# Patient Record
Sex: Female | Born: 1970 | Race: White | Hispanic: No | Marital: Single | Smoking: Never smoker
Health system: Southern US, Community
[De-identification: ages and names within clinical notes are randomized; demographics above are authoritative.]

## PROBLEM LIST (undated history)

## (undated) DIAGNOSIS — M1990 Unspecified osteoarthritis, unspecified site: Secondary | ICD-10-CM

## (undated) DIAGNOSIS — J189 Pneumonia, unspecified organism: Secondary | ICD-10-CM

## (undated) HISTORY — PX: DILATION AND CURETTAGE OF UTERUS: SHX78

## (undated) HISTORY — PX: ANKLE ARTHROSCOPY: SUR85

## (undated) HISTORY — PX: TUBAL LIGATION: SHX77

## (undated) HISTORY — PX: BACK SURGERY: SHX140

---

## 2014-08-21 ENCOUNTER — Other Ambulatory Visit: Payer: Self-pay | Admitting: Orthopedic Surgery

## 2014-08-29 ENCOUNTER — Encounter (HOSPITAL_COMMUNITY): Payer: Self-pay

## 2014-08-29 ENCOUNTER — Encounter (HOSPITAL_COMMUNITY)
Admission: RE | Admit: 2014-08-29 | Discharge: 2014-08-29 | Disposition: A | Discharge status: Home / Self Care | Payer: Worker's Compensation | Source: Ambulatory Visit | Attending: Orthopedic Surgery | Admitting: Orthopedic Surgery

## 2014-08-29 ENCOUNTER — Ambulatory Visit (HOSPITAL_COMMUNITY)
Admission: RE | Admit: 2014-08-29 | Discharge: 2014-08-29 | Disposition: A | Discharge status: Home / Self Care | Payer: Worker's Compensation | Source: Ambulatory Visit | Attending: Orthopedic Surgery | Admitting: Orthopedic Surgery

## 2014-08-29 DIAGNOSIS — M5002 Cervical disc disorder with myelopathy, mid-cervical region: Secondary | ICD-10-CM | POA: Diagnosis present

## 2014-08-29 DIAGNOSIS — Z01810 Encounter for preprocedural cardiovascular examination: Secondary | ICD-10-CM | POA: Insufficient documentation

## 2014-08-29 DIAGNOSIS — Z01818 Encounter for other preprocedural examination: Secondary | ICD-10-CM

## 2014-08-29 HISTORY — DX: Unspecified osteoarthritis, unspecified site: M19.90

## 2014-08-29 HISTORY — DX: Pneumonia, unspecified organism: J18.9

## 2014-08-29 LAB — URINE MICROSCOPIC-ADD ON

## 2014-08-29 LAB — COMPREHENSIVE METABOLIC PANEL
ALBUMIN: 3.7 g/dL (ref 3.5–5.2)
ALK PHOS: 59 U/L (ref 39–117)
ALT: 15 U/L (ref 0–35)
AST: 19 U/L (ref 0–37)
Anion gap: 10 (ref 5–15)
BUN: 12 mg/dL (ref 6–23)
CALCIUM: 8.9 mg/dL (ref 8.4–10.5)
CO2: 21 mmol/L (ref 19–32)
Chloride: 108 mEq/L (ref 96–112)
Creatinine, Ser: 0.57 mg/dL (ref 0.50–1.10)
GFR calc Af Amer: >90 mL/min (ref >90)
GFR calc non Af Amer: >90 mL/min (ref >90)
GLUCOSE: 100 mg/dL — AB (ref 70–99)
Potassium: 3.8 mmol/L (ref 3.5–5.1)
SODIUM: 139 mmol/L (ref 135–145)
TOTAL PROTEIN: 6.3 g/dL (ref 6.0–8.3)
Total Bilirubin: 0.3 mg/dL (ref 0.3–1.2)

## 2014-08-29 LAB — SURGICAL PCR SCREEN
MRSA, PCR: NEGATIVE
Staphylococcus aureus: POSITIVE — AB

## 2014-08-29 LAB — CBC WITH DIFFERENTIAL/PLATELET
BASOS ABS: 0 10*3/uL (ref 0.0–0.1)
BASOS PCT: 0 % (ref 0–1)
EOS PCT: 2 % (ref 0–5)
Eosinophils Absolute: 0.2 10*3/uL (ref 0.0–0.7)
HCT: 37.7 % (ref 36.0–46.0)
Hemoglobin: 12.9 g/dL (ref 12.0–15.0)
LYMPHS ABS: 1.9 10*3/uL (ref 0.7–4.0)
Lymphocytes Relative: 27 % (ref 12–46)
MCH: 29.6 pg (ref 26.0–34.0)
MCHC: 34.2 g/dL (ref 30.0–36.0)
MCV: 86.5 fL (ref 78.0–100.0)
Monocytes Absolute: 0.4 10*3/uL (ref 0.1–1.0)
Monocytes Relative: 5 % (ref 3–12)
NEUTROS PCT: 66 % (ref 43–77)
Neutro Abs: 4.6 10*3/uL (ref 1.7–7.7)
Platelets: 310 10*3/uL (ref 150–400)
RBC: 4.36 MIL/uL (ref 3.87–5.11)
RDW: 13.1 % (ref 11.5–15.5)
WBC: 7.1 10*3/uL (ref 4.0–10.5)

## 2014-08-29 LAB — URINALYSIS, ROUTINE W REFLEX MICROSCOPIC
Bilirubin Urine: NEGATIVE
Glucose, UA: NEGATIVE mg/dL
KETONES UR: NEGATIVE mg/dL
LEUKOCYTES UA: NEGATIVE
NITRITE: NEGATIVE
Protein, ur: NEGATIVE mg/dL
SPECIFIC GRAVITY, URINE: 1.014 (ref 1.005–1.030)
Urobilinogen, UA: 0.2 mg/dL (ref 0.0–1.0)
pH: 7 (ref 5.0–8.0)

## 2014-08-29 LAB — ABO/RH: ABO/RH(D): A POS

## 2014-08-29 LAB — PROTIME-INR
INR: 1.01 (ref 0.00–1.49)
Prothrombin Time: 13.4 seconds (ref 11.6–15.2)

## 2014-08-29 LAB — APTT: aPTT: 31 seconds (ref 24–37)

## 2014-08-29 NOTE — Progress Notes (Signed)
Mupirocin ointment Rx called into [facility] in [place], [place] for positive PCR of staph. Pt notified and voiced understanding.

## 2014-08-29 NOTE — Progress Notes (Signed)
req'd ekg, notes from pcp ([facility])

## 2014-08-29 NOTE — Pre-Procedure Instructions (Addendum)
Charlsie QuestChristy Perra  08/29/2014   Your procedure is scheduled on:  12.31.15  Report to [facility] short stay admitting at 1000 AM.  Call this number if you have problems the morning of surgery: [phone]   Remember:   Do not eat food or drink liquids after midnight.   Take these medicines the morning of surgery with A SIP OF WATER: pain med if needed   STOP all herbel meds, nsaids (aleve,naproxen,advil,ibuprofen) now including vitamins ,aspirin   Do not wear jewelry, make-up or nail polish.  Do not wear lotions, powders, or perfumes. You may wear deodorant.  Do not shave 48 hours prior to surgery. Men may shave face and neck.  Do not bring valuables to the hospital.  [facility] not [facility]                  for any belongings or valuables.               Contacts, dentures or bridgework may not be worn into surgery.  Leave suitcase in the car. After surgery it may be brought to your room.  For patients admitted to the hospital, discharge time is determined by your                treatment team.               Patients discharged the day of surgery will not be allowed to drive  home.  Name and phone number of your driver:   Special Instructions:  Special Instructions: [place] - Preparing for Surgery  Before surgery, you can play an important role.  Because skin is not sterile, your skin needs to be as free of germs as possible.  You can reduce the number of germs on you skin by washing with CHG (chlorahexidine gluconate) soap before surgery.  CHG is an antiseptic cleaner which kills germs and bonds with the skin to continue killing germs even after washing.  Please DO NOT use if you have an allergy to CHG or antibacterial soaps.  If your skin becomes reddened/irritated stop using the CHG and inform your nurse when you arrive at Short Stay.  Do not shave (including legs and underarms) for at least 48 hours prior to the first CHG shower.  You may shave your face.  Please follow  these instructions carefully:   1.  Shower with CHG Soap the night before surgery and the morning of Surgery.  2.  If you choose to wash your hair, wash your hair first as usual with your normal shampoo.  3.  After you shampoo, rinse your hair and body thoroughly to remove the Shampoo.  4.  Use CHG as you would any other liquid soap.  You can apply chg directly  to the skin and wash gently with scrungie or a clean washcloth.  5.  Apply the CHG Soap to your body ONLY FROM THE NECK DOWN.  Do not use on open wounds or open sores.  Avoid contact with your eyes ears, mouth and genitals (private parts).  Wash genitals (private parts)       with your normal soap.  6.  Wash thoroughly, paying special attention to the area where your surgery will be performed.  7.  Thoroughly rinse your body with warm water from the neck down.  8.  DO NOT shower/wash with your normal soap after using and rinsing off the CHG Soap.  9.  Pat yourself dry with a clean  towel.            10.  Wear clean pajamas.            11.  Place clean sheets on your bed the night of your first shower and do not sleep with pets.  Day of Surgery  Do not apply any lotions/deodorants the morning of surgery.  Please wear clean clothes to the hospital/surgery center.   Please read over the following fact sheets that you were given: Pain Booklet, Coughing and Deep Breathing, Blood Transfusion Information, MRSA Information and Surgical Site Infection Prevention

## 2014-09-03 NOTE — Progress Notes (Signed)
Pt called today stating that the Mupirocin ointment got thrown away accidentally and requested another rx be called in for her. She states she got 3 days in and I suggested that we treat the day of surgery and while she is here but she wanted to get it done before her surgery. I called in the Mupirocin Ointment Rx into [facility] in [place], [place].

## 2014-09-05 MED ORDER — POVIDONE-IODINE 7.5 % EX SOLN
Freq: Once | CUTANEOUS | Status: DC
  Filled 2014-09-05: qty 118

## 2014-09-05 MED ORDER — CEFAZOLIN SODIUM-DEXTROSE 2-3 GM-% IV SOLR
2.0000 g | INTRAVENOUS | Status: AC
  Administered 2014-09-06: 2 g via INTRAVENOUS
  Filled 2014-09-05: qty 50

## 2014-09-05 NOTE — Progress Notes (Signed)
Patient called to arrive at 900 am.

## 2014-09-05 NOTE — H&P (Signed)
     PREOPERATIVE H&P  Chief Complaint: bilateral arm pain and weakness  HPI: Tanya Reed is a 43 y.o. 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 Pneumonia     hx  . Arthritis    Past Surgical History  Procedure Laterality Date  . Tubal ligation    . Back surgery      x2  . Dilation and curettage of uterus      x2 miscarriages  . Ankle arthroscopy Right     debridement   History   Social History  . Marital Status: Single    Spouse Name: N/A    Number of Children: N/A  . Years of Education: N/A   Social History Main Topics  . Smoking status: Never Smoker   . Smokeless tobacco: Not on file  . Alcohol Use: 1.2 oz/week    2 Glasses of wine per week  . Drug Use: No  . Sexual Activity: Not on file   Other Topics Concern  . Not on file   Social History Narrative  . No narrative on file   No family history on file. No Known Allergies Prior to Admission medications   Medication Sig Start Date End Date Taking? Authorizing Provider  acetaminophen (TYLENOL) 500 MG tablet Take 1,000 mg by mouth every 6 (six) hours as needed.   Yes Historical Provider, MD  HYDROcodone-acetaminophen (NORCO) 10-325 MG per tablet Take 1 tablet by mouth every 6 (six) hours as needed for moderate pain or severe pain.   Yes Historical Provider, MD     All other systems have been reviewed and were otherwise negative with the exception of those mentioned in the HPI and as above.  Physical Exam: There were no vitals filed for this visit.  General: Alert, no acute distress Cardiovascular: No pedal edema Respiratory: No cyanosis, no use of accessory  musculature Skin: No lesions in the area of chief complaint Neurologic: Sensation intact distally Psychiatric: Patient is competent for consent with normal mood and affect Lymphatic: No axillary or cervical lymphadenopathy  MUSCULOSKELETAL: + hoffman's on right  Assessment/Plan: Myelopathy Plan for Procedure(s): ANTERIOR CERVICAL DECOMPRESSION/DISCECTOMY FUSION 1 LEVEL   Emilee HeroUMONSKI,Zenith Lamphier Rigor, MD 09/05/2014 3:48 PM

## 2014-09-06 ENCOUNTER — Encounter (HOSPITAL_COMMUNITY)
Admission: RE | Disposition: A | Discharge status: Home / Self Care | Payer: Self-pay | Source: Ambulatory Visit | Attending: Orthopedic Surgery

## 2014-09-06 ENCOUNTER — Ambulatory Visit (HOSPITAL_COMMUNITY): Payer: Worker's Compensation

## 2014-09-06 ENCOUNTER — Ambulatory Visit (HOSPITAL_COMMUNITY): Payer: Worker's Compensation | Admitting: Anesthesiology

## 2014-09-06 ENCOUNTER — Observation Stay (HOSPITAL_COMMUNITY)
Admission: RE | Admit: 2014-09-06 | Discharge: 2014-09-07 | Disposition: A | Discharge status: Home / Self Care | Payer: Worker's Compensation | Source: Ambulatory Visit | Attending: Orthopedic Surgery | Admitting: Orthopedic Surgery

## 2014-09-06 DIAGNOSIS — M5410 Radiculopathy, site unspecified: Secondary | ICD-10-CM | POA: Diagnosis present

## 2014-09-06 DIAGNOSIS — G959 Disease of spinal cord, unspecified: Secondary | ICD-10-CM

## 2014-09-06 DIAGNOSIS — M5002 Cervical disc disorder with myelopathy, mid-cervical region: Principal | ICD-10-CM | POA: Insufficient documentation

## 2014-09-06 HISTORY — PX: ANTERIOR CERVICAL DECOMP/DISCECTOMY FUSION: SHX1161

## 2014-09-06 LAB — TYPE AND SCREEN
ABO/RH(D): A POS
ABO/RH(D): A POS
Antibody Screen: NEGATIVE
Antibody Screen: NEGATIVE

## 2014-09-06 SURGERY — ANTERIOR CERVICAL DECOMPRESSION/DISCECTOMY FUSION 1 LEVEL
Anesthesia: General

## 2014-09-06 MED ORDER — SODIUM CHLORIDE 0.9 % IJ SOLN
3.0000 mL | Freq: Two times a day (BID) | INTRAMUSCULAR | Status: DC
  Administered 2014-09-06: 3 mL via INTRAVENOUS

## 2014-09-06 MED ORDER — CEFAZOLIN SODIUM 1-5 GM-% IV SOLN
1.0000 g | Freq: Three times a day (TID) | INTRAVENOUS | Status: AC
  Administered 2014-09-06 (×2): 1 g via INTRAVENOUS
  Filled 2014-09-06 (×2): qty 50

## 2014-09-06 MED ORDER — BISACODYL 5 MG PO TBEC
5.0000 mg | DELAYED_RELEASE_TABLET | Freq: Every day | ORAL | Status: DC | PRN
  Filled 2014-09-06: qty 1

## 2014-09-06 MED ORDER — ACETAMINOPHEN 160 MG/5ML PO SOLN
325.0000 mg | ORAL | Status: DC | PRN
  Filled 2014-09-06: qty 20.3

## 2014-09-06 MED ORDER — HYDROMORPHONE HCL 1 MG/ML IJ SOLN
INTRAMUSCULAR | Status: AC
  Filled 2014-09-06: qty 1

## 2014-09-06 MED ORDER — HYDROMORPHONE HCL 1 MG/ML IJ SOLN
0.2500 mg | INTRAMUSCULAR | Status: DC | PRN
  Administered 2014-09-06 (×3): 0.5 mg via INTRAVENOUS

## 2014-09-06 MED ORDER — INFLUENZA VAC SPLIT QUAD 0.5 ML IM SUSY
0.5000 mL | PREFILLED_SYRINGE | INTRAMUSCULAR | Status: DC
  Filled 2014-09-06: qty 0.5

## 2014-09-06 MED ORDER — MENTHOL 3 MG MT LOZG: 1.0000 | LOZENGE | OROMUCOSAL | Status: DC | PRN

## 2014-09-06 MED ORDER — CEFAZOLIN SODIUM-DEXTROSE 2-3 GM-% IV SOLR
INTRAVENOUS | Status: DC | PRN
  Administered 2014-09-06: 2 g via INTRAVENOUS

## 2014-09-06 MED ORDER — ACETAMINOPHEN 650 MG RE SUPP: 650.0000 mg | RECTAL | Status: DC | PRN

## 2014-09-06 MED ORDER — BUPIVACAINE-EPINEPHRINE 0.25% -1:200000 IJ SOLN
INTRAMUSCULAR | Status: DC | PRN
  Administered 2014-09-06: 10 mL

## 2014-09-06 MED ORDER — THROMBIN 20000 UNITS EX SOLR
CUTANEOUS | Status: AC
  Filled 2014-09-06: qty 20000

## 2014-09-06 MED ORDER — ACETAMINOPHEN 325 MG PO TABS: 650.0000 mg | ORAL_TABLET | ORAL | Status: DC | PRN

## 2014-09-06 MED ORDER — DOCUSATE SODIUM 100 MG PO CAPS: 100.0000 mg | ORAL_CAPSULE | Freq: Two times a day (BID) | ORAL | Status: DC

## 2014-09-06 MED ORDER — FENTANYL CITRATE 0.05 MG/ML IJ SOLN
INTRAMUSCULAR | Status: AC
  Filled 2014-09-06: qty 5

## 2014-09-06 MED ORDER — DEXAMETHASONE SODIUM PHOSPHATE 4 MG/ML IJ SOLN
INTRAMUSCULAR | Status: DC | PRN
Start: 2014-09-06 — End: 2014-09-06
  Administered 2014-09-06: 4 mg via INTRAVENOUS

## 2014-09-06 MED ORDER — ROCURONIUM BROMIDE 100 MG/10ML IV SOLN
INTRAVENOUS | Status: DC | PRN
  Administered 2014-09-06: 40 mg via INTRAVENOUS

## 2014-09-06 MED ORDER — MIDAZOLAM HCL 2 MG/2ML IJ SOLN
INTRAMUSCULAR | Status: AC
  Filled 2014-09-06: qty 2

## 2014-09-06 MED ORDER — MIDAZOLAM HCL 5 MG/5ML IJ SOLN
INTRAMUSCULAR | Status: DC | PRN
  Administered 2014-09-06: 2 mg via INTRAVENOUS

## 2014-09-06 MED ORDER — FENTANYL CITRATE 0.05 MG/ML IJ SOLN
INTRAMUSCULAR | Status: DC | PRN
  Administered 2014-09-06: 50 ug via INTRAVENOUS
  Administered 2014-09-06 (×3): 25 ug via INTRAVENOUS
  Administered 2014-09-06: 50 ug via INTRAVENOUS
  Administered 2014-09-06: 150 ug via INTRAVENOUS
  Administered 2014-09-06: 50 ug via INTRAVENOUS
  Administered 2014-09-06: 25 ug via INTRAVENOUS
  Administered 2014-09-06: 50 ug via INTRAVENOUS

## 2014-09-06 MED ORDER — FLEET ENEMA 7-19 GM/118ML RE ENEM
1.0000 | ENEMA | Freq: Once | RECTAL | Status: AC | PRN
  Filled 2014-09-06: qty 1

## 2014-09-06 MED ORDER — PHENYLEPHRINE HCL 10 MG/ML IJ SOLN
INTRAMUSCULAR | Status: DC | PRN
  Administered 2014-09-06: 40 ug via INTRAVENOUS
  Administered 2014-09-06: 80 ug via INTRAVENOUS

## 2014-09-06 MED ORDER — DIAZEPAM 5 MG PO TABS
ORAL_TABLET | ORAL | Status: AC
  Filled 2014-09-06: qty 1

## 2014-09-06 MED ORDER — NEOSTIGMINE METHYLSULFATE 10 MG/10ML IV SOLN
INTRAVENOUS | Status: DC | PRN
Start: 2014-09-06 — End: 2014-09-06
  Administered 2014-09-06: 4 mg via INTRAVENOUS

## 2014-09-06 MED ORDER — PHENOL 1.4 % MT LIQD: 1.0000 | OROMUCOSAL | Status: DC | PRN

## 2014-09-06 MED ORDER — THROMBIN 20000 UNITS EX SOLR
OROMUCOSAL | Status: DC | PRN
  Administered 2014-09-06: 20 mL via TOPICAL

## 2014-09-06 MED ORDER — ONDANSETRON HCL 4 MG/2ML IJ SOLN
4.0000 mg | INTRAMUSCULAR | Status: DC | PRN
  Administered 2014-09-06: 4 mg via INTRAVENOUS
  Filled 2014-09-06: qty 2

## 2014-09-06 MED ORDER — LIDOCAINE HCL (CARDIAC) 20 MG/ML IV SOLN
INTRAVENOUS | Status: DC | PRN
Start: 2014-09-06 — End: 2014-09-06
  Administered 2014-09-06: 70 mg via INTRAVENOUS

## 2014-09-06 MED ORDER — ACETAMINOPHEN 325 MG PO TABS: 325.0000 mg | ORAL_TABLET | ORAL | Status: DC | PRN

## 2014-09-06 MED ORDER — OXYCODONE HCL 5 MG/5ML PO SOLN: 5.0000 mg | Freq: Once | ORAL | Status: AC | PRN

## 2014-09-06 MED ORDER — OXYCODONE HCL 5 MG PO TABS
ORAL_TABLET | ORAL | Status: AC
  Filled 2014-09-06: qty 1

## 2014-09-06 MED ORDER — GLYCOPYRROLATE 0.2 MG/ML IJ SOLN
INTRAMUSCULAR | Status: DC | PRN
  Administered 2014-09-06: .6 mg via INTRAVENOUS

## 2014-09-06 MED ORDER — LIDOCAINE HCL 4 % MT SOLN
OROMUCOSAL | Status: DC | PRN
  Administered 2014-09-06: 2 mL via TOPICAL

## 2014-09-06 MED ORDER — DEXAMETHASONE SODIUM PHOSPHATE 10 MG/ML IJ SOLN
INTRAMUSCULAR | Status: DC | PRN
  Administered 2014-09-06: 10 mg via INTRAVENOUS

## 2014-09-06 MED ORDER — OXYCODONE-ACETAMINOPHEN 5-325 MG PO TABS: 1.0000 | ORAL_TABLET | ORAL | Status: DC | PRN

## 2014-09-06 MED ORDER — SENNOSIDES-DOCUSATE SODIUM 8.6-50 MG PO TABS
1.0000 | ORAL_TABLET | Freq: Every evening | ORAL | Status: DC | PRN
  Filled 2014-09-06: qty 1

## 2014-09-06 MED ORDER — ZOLPIDEM TARTRATE 5 MG PO TABS: 5.0000 mg | ORAL_TABLET | Freq: Every evening | ORAL | Status: DC | PRN

## 2014-09-06 MED ORDER — LACTATED RINGERS IV SOLN
INTRAVENOUS | Status: DC
  Administered 2014-09-06 (×2): via INTRAVENOUS

## 2014-09-06 MED ORDER — PROPOFOL 10 MG/ML IV BOLUS
INTRAVENOUS | Status: DC | PRN
  Administered 2014-09-06: 140 mg via INTRAVENOUS

## 2014-09-06 MED ORDER — BUPIVACAINE-EPINEPHRINE (PF) 0.25% -1:200000 IJ SOLN
INTRAMUSCULAR | Status: AC
  Filled 2014-09-06: qty 30

## 2014-09-06 MED ORDER — ALUM & MAG HYDROXIDE-SIMETH 200-200-20 MG/5ML PO SUSP: 30.0000 mL | Freq: Four times a day (QID) | ORAL | Status: DC | PRN

## 2014-09-06 MED ORDER — DIAZEPAM 5 MG PO TABS
5.0000 mg | ORAL_TABLET | Freq: Four times a day (QID) | ORAL | Status: DC | PRN
  Administered 2014-09-06: 5 mg via ORAL

## 2014-09-06 MED ORDER — MORPHINE SULFATE 2 MG/ML IJ SOLN
1.0000 mg | INTRAMUSCULAR | Status: DC | PRN
  Administered 2014-09-06: 2 mg via INTRAVENOUS
  Filled 2014-09-06: qty 1

## 2014-09-06 MED ORDER — SODIUM CHLORIDE 0.9 % IJ SOLN: 3.0000 mL | INTRAMUSCULAR | Status: DC | PRN

## 2014-09-06 MED ORDER — OXYCODONE HCL 5 MG PO TABS
5.0000 mg | ORAL_TABLET | Freq: Once | ORAL | Status: AC | PRN
  Administered 2014-09-06: 5 mg via ORAL

## 2014-09-06 MED ORDER — ONDANSETRON HCL 4 MG/2ML IJ SOLN
INTRAMUSCULAR | Status: DC | PRN
  Administered 2014-09-06: 4 mg via INTRAVENOUS

## 2014-09-06 SURGICAL SUPPLY — 75 items
BENZOIN TINCTURE PRP APPL 2/3 (GAUZE/BANDAGES/DRESSINGS) ×2 IMPLANT
BIT DRILL NEURO 2X3.1 SFT TUCH (MISCELLANEOUS) ×1 IMPLANT
BIT DRILL SRG 14X2.2XFLT CHK (BIT) ×1 IMPLANT
BIT DRL SRG 14X2.2XFLT CHK (BIT) ×1
BLADE SURG 15 STRL LF DISP TIS (BLADE) ×1 IMPLANT
BLADE SURG 15 STRL SS (BLADE) ×1
BLADE SURG ROTATE 9660 (MISCELLANEOUS) ×2 IMPLANT
BUR MATCHSTICK NEURO 3.0 LAGG (BURR) IMPLANT
CARTRIDGE OIL MAESTRO DRILL (MISCELLANEOUS) ×1 IMPLANT
CLSR STERI-STRIP ANTIMIC 1/2X4 (GAUZE/BANDAGES/DRESSINGS) ×2 IMPLANT
COLLAR CERV LO CONTOUR FIRM DE (SOFTGOODS) ×2 IMPLANT
CORDS BIPOLAR (ELECTRODE) ×2 IMPLANT
COVER BACK TABLE 80X110 HD (DRAPES) ×2 IMPLANT
COVER SURGICAL LIGHT HANDLE (MISCELLANEOUS) ×2 IMPLANT
CRADLE DONUT ADULT HEAD (MISCELLANEOUS) ×2 IMPLANT
DIFFUSER DRILL AIR PNEUMATIC (MISCELLANEOUS) ×2 IMPLANT
DRAIN JACKSON RD 7FR 3/32 (WOUND CARE) IMPLANT
DRAPE C-ARM 42X72 X-RAY (DRAPES) ×2 IMPLANT
DRAPE POUCH INSTRU U-SHP 10X18 (DRAPES) ×2 IMPLANT
DRAPE SURG 17X23 STRL (DRAPES) ×6 IMPLANT
DRILL BIT SKYLINE 14MM (BIT) ×1
DRILL NEURO 2X3.1 SOFT TOUCH (MISCELLANEOUS) ×2
DURAPREP 26ML APPLICATOR (WOUND CARE) ×2 IMPLANT
ELECT COATED BLADE 2.86 ST (ELECTRODE) ×2 IMPLANT
ELECT REM PT RETURN 9FT ADLT (ELECTROSURGICAL) ×2
ELECTRODE REM PT RTRN 9FT ADLT (ELECTROSURGICAL) ×1 IMPLANT
EVACUATOR SILICONE 100CC (DRAIN) IMPLANT
GAUZE SPONGE 4X4 12PLY STRL (GAUZE/BANDAGES/DRESSINGS) ×2 IMPLANT
GAUZE SPONGE 4X4 16PLY XRAY LF (GAUZE/BANDAGES/DRESSINGS) ×2 IMPLANT
GLOVE BIO SURGEON STRL SZ7 (GLOVE) ×2 IMPLANT
GLOVE BIO SURGEON STRL SZ8 (GLOVE) ×2 IMPLANT
GLOVE BIOGEL PI IND STRL 7.0 (GLOVE) ×2 IMPLANT
GLOVE BIOGEL PI IND STRL 8 (GLOVE) ×1 IMPLANT
GLOVE BIOGEL PI INDICATOR 7.0 (GLOVE) ×2
GLOVE BIOGEL PI INDICATOR 8 (GLOVE) ×1
GOWN STRL REUS W/ TWL LRG LVL3 (GOWN DISPOSABLE) ×1 IMPLANT
GOWN STRL REUS W/ TWL XL LVL3 (GOWN DISPOSABLE) ×1 IMPLANT
GOWN STRL REUS W/TWL LRG LVL3 (GOWN DISPOSABLE) ×1
GOWN STRL REUS W/TWL XL LVL3 (GOWN DISPOSABLE) ×1
IMPL S ENDOSKEL TC 7 ODEG (Orthopedic Implant) ×1 IMPLANT
IMPLANT S ENDOSKEL TC 7 ODEG (Orthopedic Implant) ×2 IMPLANT
IV CATH 14GX2 1/4 (CATHETERS) ×2 IMPLANT
KIT BASIN OR (CUSTOM PROCEDURE TRAY) ×2 IMPLANT
KIT ROOM TURNOVER OR (KITS) ×2 IMPLANT
MANIFOLD NEPTUNE II (INSTRUMENTS) ×2 IMPLANT
NEEDLE 27GAX1X1/2 (NEEDLE) ×2 IMPLANT
NEEDLE SPNL 20GX3.5 QUINCKE YW (NEEDLE) ×2 IMPLANT
NS IRRIG 1000ML POUR BTL (IV SOLUTION) ×2 IMPLANT
OIL CARTRIDGE MAESTRO DRILL (MISCELLANEOUS) ×2
PACK ORTHO CERVICAL (CUSTOM PROCEDURE TRAY) ×2 IMPLANT
PAD ARMBOARD 7.5X6 YLW CONV (MISCELLANEOUS) ×4 IMPLANT
PATTIES SURGICAL .5 X.5 (GAUZE/BANDAGES/DRESSINGS) IMPLANT
PATTIES SURGICAL .5 X1 (DISPOSABLE) IMPLANT
PIN DISTRACTION 14 (PIN) ×4 IMPLANT
PLATE SKYLINE 12MM (Plate) ×2 IMPLANT
PUTTY BONE DBX 2.5 MIS (Bone Implant) ×2 IMPLANT
SCREW VAR SELF TAP SKYLINE 14M (Screw) ×8 IMPLANT
SPONGE GAUZE 4X4 12PLY STER LF (GAUZE/BANDAGES/DRESSINGS) ×2 IMPLANT
SPONGE INTESTINAL PEANUT (DISPOSABLE) ×2 IMPLANT
SPONGE SURGIFOAM ABS GEL 100 (HEMOSTASIS) IMPLANT
STRIP CLOSURE SKIN 1/2X4 (GAUZE/BANDAGES/DRESSINGS) ×2 IMPLANT
SURGIFLO TRUKIT (HEMOSTASIS) IMPLANT
SUT MNCRL AB 4-0 PS2 18 (SUTURE) ×2 IMPLANT
SUT SILK 4 0 (SUTURE)
SUT SILK 4-0 18XBRD TIE 12 (SUTURE) IMPLANT
SUT VIC AB 2-0 CT2 18 VCP726D (SUTURE) ×2 IMPLANT
SYR BULB IRRIGATION 50ML (SYRINGE) ×2 IMPLANT
SYR CONTROL 10ML LL (SYRINGE) ×4 IMPLANT
TAPE CLOTH 4X10 WHT NS (GAUZE/BANDAGES/DRESSINGS) ×2 IMPLANT
TAPE CLOTH SURG 4X10 WHT LF (GAUZE/BANDAGES/DRESSINGS) ×2 IMPLANT
TAPE UMBILICAL COTTON 1/8X30 (MISCELLANEOUS) ×2 IMPLANT
TOWEL OR 17X24 6PK STRL BLUE (TOWEL DISPOSABLE) ×2 IMPLANT
TOWEL OR 17X26 10 PK STRL BLUE (TOWEL DISPOSABLE) ×2 IMPLANT
WATER STERILE IRR 1000ML POUR (IV SOLUTION) ×2 IMPLANT
YANKAUER SUCT BULB TIP NO VENT (SUCTIONS) ×2 IMPLANT

## 2014-09-06 NOTE — Plan of Care (Signed)
Problem: Consults Goal: Diagnosis - Spinal Surgery Outcome: Completed/Met Date Met:  09/06/14 Cervical Spine Fusion     

## 2014-09-06 NOTE — Anesthesia Preprocedure Evaluation (Addendum)
Anesthesia Evaluation  Patient identified by MRN, date of birth, ID band Patient awake    Reviewed: Allergy & Precautions, H&P , NPO status , Patient's Chart, lab work & pertinent test results  History of Anesthesia Complications Negative for: history of anesthetic complications  Airway Mallampati: II  TM Distance: >3 FB     Dental  (+) Teeth Intact, Dental Advisory Given   Pulmonary neg pulmonary ROS,  breath sounds clear to auscultation        Cardiovascular negative cardio ROS  Rhythm:Regular     Neuro/Psych C5/6 cord compression  Neuromuscular disease negative psych ROS   GI/Hepatic negative GI ROS, Neg liver ROS,   Endo/Other  Morbid obesity  Renal/GU negative Renal ROS     Musculoskeletal   Abdominal   Peds  Hematology negative hematology ROS (+)   Anesthesia Other Findings   Reproductive/Obstetrics                            Anesthesia Physical Anesthesia Plan  ASA: II  Anesthesia Plan: General   Post-op Pain Management:    Induction: Intravenous  Airway Management Planned: Oral ETT  Additional Equipment: None  Intra-op Plan:   Post-operative Plan: Extubation in OR  Informed Consent: I have reviewed the patients History and Physical, chart, labs and discussed the procedure including the risks, benefits and alternatives for the proposed anesthesia with the patient or authorized representative who has indicated his/her understanding and acceptance.   Dental advisory given  Plan Discussed with: CRNA and Surgeon  Anesthesia Plan Comments:         Anesthesia Quick Evaluation

## 2014-09-06 NOTE — Progress Notes (Signed)
Orthopedic Tech Progress Note Patient Details:  Tanya Reed April 24, 1971 [id number] Patient already has soft collar on. Patient ID: Tanya Reed, female   DOB: April 24, 1971, [age] y.o.   MRN: [id number]   Jennye MoccasinHughes, Cordel Drewes Craig 09/06/2014, 3:33 PM

## 2014-09-06 NOTE — Anesthesia Procedure Notes (Signed)
Procedure Name: Intubation Date/Time: 09/06/2014 10:57 AM Performed by: Minus LibertyAVENEL, Mertis Mosher Pre-anesthesia Checklist: Patient identified, Emergency Drugs available, Suction available, Patient being monitored and Timeout performed Patient Re-evaluated:Patient Re-evaluated prior to inductionOxygen Delivery Method: Circle system utilized Preoxygenation: Pre-oxygenation with 100% oxygen Intubation Type: IV induction Ventilation: Mask ventilation without difficulty Laryngoscope Size: Mac and 3 Grade View: Grade II Tube type: Oral Tube size: 7.0 mm Number of attempts: 1 Airway Equipment and Method: Stylet Placement Confirmation: ETT inserted through vocal cords under direct vision,  breath sounds checked- equal and bilateral,  positive ETCO2 and CO2 detector Secured at: 22 cm Tube secured with: Tape Dental Injury: Teeth and Oropharynx as per pre-operative assessment  Comments: Patient head/neck positioned by patient. Midline stabilization maintained DL.

## 2014-09-06 NOTE — Transfer of Care (Signed)
Immediate Anesthesia Transfer of Care Note  Patient: Tanya QuestChristy Reed  Procedure(s) Performed: Procedure(s) with comments: ANTERIOR CERVICAL DECOMPRESSION/DISCECTOMY FUSION 1 LEVEL (N/A) - Anterior cervical decompression fusion cervical 5-6 with instrumentation and allograft.  Patient Location: PACU  Anesthesia Type:General  Level of Consciousness: awake, alert  and oriented  Airway & Oxygen Therapy: Patient Spontanous Breathing and Patient connected to nasal cannula oxygen  Post-op Assessment: Report given to PACU RN and Post -op Vital signs reviewed and stable  Post vital signs: Reviewed and stable  Complications: No apparent anesthesia complications

## 2014-09-06 NOTE — Anesthesia Postprocedure Evaluation (Signed)
  Anesthesia Post-op Note  Patient: Tanya Reed  Procedure(s) Performed: Procedure(s) with comments: ANTERIOR CERVICAL DECOMPRESSION/DISCECTOMY FUSION 1 LEVEL (N/A) - Anterior cervical decompression fusion cervical 5-6 with instrumentation and allograft.  Patient Location: PACU  Anesthesia Type:General  Level of Consciousness: awake and alert   Airway and Oxygen Therapy: Patient Spontanous Breathing  Post-op Pain: mild  Post-op Assessment: Post-op Vital signs reviewed, Patient's Cardiovascular Status Stable, Respiratory Function Stable, Patent Airway, No signs of Nausea or vomiting and Pain level controlled  Post-op Vital Signs: Reviewed and stable  Last Vitals:  Filed Vitals:   09/06/14 1315  BP:   Pulse: 73  Temp:   Resp: 15    Complications: No apparent anesthesia complications

## 2014-09-07 DIAGNOSIS — M5002 Cervical disc disorder with myelopathy, mid-cervical region: Secondary | ICD-10-CM | POA: Diagnosis not present

## 2014-09-07 MED ORDER — ACETAMINOPHEN-CODEINE #3 300-30 MG PO TABS: 1.0000 | ORAL_TABLET | Freq: Four times a day (QID) | ORAL | Status: AC | PRN

## 2014-09-07 NOTE — Progress Notes (Signed)
PATIENT ID: Camiya Vinal  MRN: [id number]  DOB/AGE:  [birth year] / 44 y.o.  1 Day Post-Op Procedure(s) (LRB): ANTERIOR CERVICAL DECOMPRESSION/DISCECTOMY FUSION 1 LEVEL (N/A)    PROGRESS NOTE Subjective:   Patient is alert, oriented, yes Nausea, yes Vomiting, yes passing gas, yes Bowel Movement. Taking PO sips now that her nausea has subsided. Denies SOB, Chest or Calf Pain.  Patient reports pain as 4 on 0-10 scale,    Objective: Vital signs in last 24 hours: Temp:  [97 F (36.1 C)-98.1 F (36.7 C)] 97.8 F (36.6 C) (01/01 0815) Pulse Rate:  [67-101] 79 (01/01 0815) Resp:  [10-20] 18 (01/01 0815) BP: (93-121)/(59-80) 93/59 mmHg (01/01 0815) SpO2:  [91 %-98 %] 97 % (01/01 0815)    Intake/Output from previous day: I/O last 3 completed shifts: In: 1840 [P.O.:540; I.V.:1300] Out: 980 [Urine:180; Emesis/NG output:750; Blood:50]   Intake/Output this shift:     LABORATORY DATA: No results for input(s): WBC, HGB, HCT, PLT, NA, K, CL, CO2, BUN, CREATININE, GLUCOSE, GLUCAP, INR, CALCIUM in the last 72 hours.  Invalid input(s): PT, 2  Examination: Neurologically intact Neurovascular intact Sensation intact distally Intact pulses distally Incision: dressing C/D/I}  Assessment:   1 Day Post-Op Procedure(s) (LRB): ANTERIOR CERVICAL DECOMPRESSION/DISCECTOMY FUSION 1 LEVEL (N/A) ADDITIONAL DIAGNOSIS:    Plan: Soft collar at all times  Rx for valium and tylenol #3 in chart  DISCHARGE PLAN: Home  Follow up in office with Dr. Yevette Edwards 2 weeks PO.     Aleiya Rye R 09/07/2014, 10:24 AM

## 2014-09-07 NOTE — Discharge Summary (Signed)
Patient ID: Tanya Reed MRN: [id number] DOB/AGE: 04/21/1971 44 y.o.  Admit date: 09/06/2014 Discharge date: 09/07/2014  Admission Diagnoses:  Active Problems:   Radiculopathy   Discharge Diagnoses:  Same  Past Medical History  Diagnosis Date  . Pneumonia     hx  . Arthritis     Surgeries: Procedure(s): ANTERIOR CERVICAL DECOMPRESSION/DISCECTOMY FUSION 1 LEVEL on 09/06/2014   Consultants:    Discharged Condition: Improved  Hospital Course: Tanya Reed is an 44 y.o. female who was admitted 09/06/2014 for operative treatment of<principal problem not specified>. Patient has severe unremitting pain that affects sleep, daily activities, and work/hobbies. After pre-op clearance the patient was taken to the operating room on 09/06/2014 and underwent  Procedure(s): ANTERIOR CERVICAL DECOMPRESSION/DISCECTOMY FUSION 1 LEVEL.    Patient was given perioperative antibiotics: Anti-infectives    Start     Dose/Rate Route Frequency Ordered Stop   09/06/14 1515  ceFAZolin (ANCEF) IVPB 1 g/50 mL premix     1 g100 mL/hr over 30 Minutes Intravenous Every 8 hours 09/06/14 1436 09/07/14 0009   09/06/14 0600  ceFAZolin (ANCEF) IVPB 2 g/50 mL premix     2 g100 mL/hr over 30 Minutes Intravenous On call to O.R. 09/05/14 1341 09/06/14 1045       Patient was given sequential compression devices, early ambulation, and chemoprophylaxis to prevent DVT.  Patient benefited maximally from hospital stay and there were no complications.    Recent vital signs: Patient Vitals for the past 24 hrs:  BP Temp Temp src Pulse Resp SpO2  09/07/14 0815 (!) 93/59 mmHg 97.8 F (36.6 C) Oral 79 18 97 %  09/07/14 0350 110/72 mmHg 98.1 F (36.7 C) Oral 69 20 96 %  09/06/14 2350 121/79 mmHg 98 F (36.7 C) Oral 76 18 96 %  09/06/14 2000 118/80 mmHg 97.9 F (36.6 C) Oral 83 16 97 %  09/06/14 1702 111/74 mmHg 97.5 F (36.4 C) - (!) 101 18 97 %  09/06/14 1435 116/67 mmHg 97.8 F (36.6 C) - 71 18 95 %   09/06/14 1411 107/74 mmHg 97 F (36.1 C) - 87 14 98 %  09/06/14 1400 104/64 mmHg - - 76 16 98 %  09/06/14 1345 - - - 67 10 96 %  09/06/14 1341 104/68 mmHg - - - - -  09/06/14 1330 109/65 mmHg - - 69 15 97 %  09/06/14 1315 - - - 73 15 98 %  09/06/14 1311 106/62 mmHg - - - - -  09/06/14 1300 - - - 69 11 95 %  09/06/14 1256 114/69 mmHg - - - - -  09/06/14 1245 - - - 82 12 97 %  09/06/14 1241 120/73 mmHg - - - - -  09/06/14 1230 - - - 84 11 91 %  09/06/14 1226 116/74 mmHg 97.6 F (36.4 C) - - - -     Recent laboratory studies: No results for input(s): WBC, HGB, HCT, PLT, NA, K, CL, CO2, BUN, CREATININE, GLUCOSE, INR, CALCIUM in the last 72 hours.  Invalid input(s): PT, 2   Discharge Medications:     Medication List    TAKE these medications        acetaminophen 500 MG tablet  Commonly known as:  TYLENOL  Take 1,000 mg by mouth every 6 (six) hours as needed.     acetaminophen-codeine 300-30 MG per tablet  Commonly known as:  TYLENOL #3  Take 1-2 tablets by mouth every 6 (six) hours as needed for moderate pain.  Diagnostic Studies: Dg Chest 2 View  08/29/2014   CLINICAL DATA:  Preop for surgery.  EXAM: CHEST  2 VIEW  COMPARISON:  None.  FINDINGS: The heart size and mediastinal contours are within normal limits. Both lungs are clear. The visualized skeletal structures are unremarkable.  IMPRESSION: No active cardiopulmonary disease.   Electronically Signed   By: Elige Ko   On: 08/29/2014 16:02   Dg Cervical Spine 1 View  09/06/2014   CLINICAL DATA:  ACDF at C5-6 ; 8.7 seconds fluoro time reported  EXAM: DG CERVICAL SPINE - 1 VIEW; DG C-ARM 61-120 MIN  COMPARISON:  2 fluoro spot films of the cervical spine of today's date are reviewed.  FINDINGS: The patient has undergone anterior cervical fusion at C5-6 with intradiscal device placement. Radiographic positioning of the prosthetic components is good. There is surgical sponge material over the soft tissues of the lower  neck. The trachea and esophagus are intubated. The temperature probe coils upon itself and the tip lies over the posterior aspect of the oropharynx.  IMPRESSION: The patient has undergone ACDF with inter discal device placement at C5-6 without immediate postprocedure complication.   Electronically Signed   By: David  [place]   On: 09/06/2014 12:21   Dg C-arm 1-60 Min  09/06/2014   CLINICAL DATA:  ACDF at C5-6 ; 8.7 seconds fluoro time reported  EXAM: DG CERVICAL SPINE - 1 VIEW; DG C-ARM 61-120 MIN  COMPARISON:  2 fluoro spot films of the cervical spine of today's date are reviewed.  FINDINGS: The patient has undergone anterior cervical fusion at C5-6 with intradiscal device placement. Radiographic positioning of the prosthetic components is good. There is surgical sponge material over the soft tissues of the lower neck. The trachea and esophagus are intubated. The temperature probe coils upon itself and the tip lies over the posterior aspect of the oropharynx.  IMPRESSION: The patient has undergone ACDF with inter discal device placement at C5-6 without immediate postprocedure complication.   Electronically Signed   By: David  [place]   On: 09/06/2014 12:21    Disposition: Final discharge disposition not confirmed      Discharge Instructions    Call MD / Call 911    Complete by:  As directed   If you experience chest pain or shortness of breath, CALL 911 and be transported to the hospital emergency room.  If you develope a fever above 101 F, pus (white drainage) or increased drainage or redness at the wound, or calf pain, call your surgeon's office.     Constipation Prevention    Complete by:  As directed   Drink plenty of fluids.  Prune juice may be helpful.  You may use a stool softener, such as Colace (over the counter) 100 mg twice a day.  Use MiraLax (over the counter) for constipation as needed.     Diet - low sodium heart healthy    Complete by:  As directed      Discharge instructions     Complete by:  As directed   Follow up in office with Dr. Yevette Edwards in 2 weeks.     Driving restrictions    Complete by:  As directed   No driving for 2 weeks     Increase activity slowly as tolerated    Complete by:  As directed            Follow-up Information    Follow up with Emilee Hero, MD.   Specialty:  Orthopedic  Surgery   Contact information:   [address] [place] [address] [phone]        Signed: Vear Clock, Ayala Ribble R 09/07/2014, 10:29 AM

## 2014-09-07 NOTE — Progress Notes (Signed)
Patient alert and oriented, mae's well, voiding adequate amount of urine, swallowing without difficulty, no c/o pain. Patient discharged home with family. Script and discharged instructions given to patient. Patient and family stated understanding of d/c instructions given and has an appointment with MD. Aisha Jaivian Battaglini RN 

## 2014-09-07 NOTE — Op Note (Signed)
NAMEGERYL, DOHN NO.:  [id number]  MEDICAL RECORD NO.:  [id number]  LOCATION:  3C04C                        FACILITY:  [facility]  PHYSICIAN:  Estill Bamberg, MD      DATE OF BIRTH:  29-Aug-1971  DATE OF PROCEDURE:  09/06/2014 DATE OF DISCHARGE:                              OPERATIVE REPORT   PREOPERATIVE DIAGNOSES: 1. Cervical myelopathy. 2. 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  POSTOPERATIVE DIAGNOSES: 1. Cervical myelopathy. 2. 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  PROCEDURES: 1. Anterior cervical decompression and fusion, C5-6. 2. Placement of anterior instrumentation, C5-6. 3. Insertion of interbody device x1 (7-mm small Titan interbody     spacer). 4. Use of morselized allograft - DBX mix. 5. Intraoperative use of fluoroscopy.  SURGEON:  Estill Bamberg, MD  ASSISTANT:  Jason Coop, PA-C  ANESTHESIA:  General endotracheal anesthesia.  COMPLICATIONS:  None.  DISPOSITION:  Stable.  ESTIMATED BLOOD LOSS:  Minimal.  INDICATIONS FOR SURGERY:  Briefly, Ms. Tanya Reed is a very pleasant 85- year-old female, who was injured at work on March 19, 2014.  Since that date, she has been having ongoing pain in her neck as well as numbness and tingling in her right arm and hand.  She also states that she was dropping objects at an increasing rate.  An MRI did reveal substantial pressure on the spinal cord at C5-6.  Given this finding and her ongoing symptoms, we did discuss proceeding with the procedure noted above.  The patient did fully understand the risks and limitations of the procedure.  OPERATIVE DETAILS:  On September 06, 2014, the patient was brought to Surgery and general endotracheal anesthesia was administered.  The patient was placed supine on the hospital bed.  All bony prominences were meticulously padded.  The neck was gently extended.  The neck was prepped and draped in the usual  fashion and a time-out procedure was performed.  I then made a left-sided transverse incision.  The platysma was incised and the plane between the sternocleidomastoid muscle and the strap muscles was identified and explored.  The esophagus was retracted medially and the anterior spine was noted.  A lateral intraoperative fluoroscopic view did confirm the appropriate operative level.  I then placed a self-retaining retractor.  The vertebral bodies of C5 and C6 were subperiosteally exposed.  Caspar pins were placed into the C5 and C6 vertebral bodies and distraction was applied.  I then went forward with a thorough and complete intervertebral diskectomy.  I was very pleased with the diskectomy that I was able to accomplish.  I was able to confirm a thorough and complete diskectomy by using a nerve hook. The endplates were appropriately prepared.  I then placed a series of interbody spacers and I did feel that a 7-mm small interbody spacer would be the most appropriate fit.  The space was then packed with DBX mix and tamped into position.  Distraction was then discontinued and the Caspar pins were removed, the bone wax was placed in their place.  A 12- mm anterior plate was placed over the anterior cervical spine.  The 14- mm variable angle screws were placed,  two in each vertebral body at C5 and C6.  I was very pleased with the press-fit of the screws.  The screws were then locked to the plate.  Of note, the wound was copiously irrigated prior to placing the intervertebral implant.  The wound was then explored for any undue bleeding and there was none encountered. The platysma was then closed using 2-0 Vicryl.  The skin was then closed using 3-0 Monocryl.  Benzoin and Steri-Strips were applied followed by sterile dressing.  All instrument counts were correct at the termination of the procedure.  Of note, Jason Coop, was my assistant throughout the entirety of the surgery, and did aid  in retraction, suctioning and closure.     Estill Bamberg, MD     MD/MEDQ  D:  09/06/2014  T:  09/06/2014  Job:  [id number]

## 2014-09-10 ENCOUNTER — Encounter (HOSPITAL_COMMUNITY): Payer: Self-pay | Admitting: Orthopedic Surgery

## 2016-03-15 IMAGING — CR DG CHEST 2V
2 series · 2 of 2 positions shown · non-contrast
Comparison: None.

CLINICAL DATA: Preop for surgery.

EXAM:
CHEST  2 VIEW

[w chest pa]
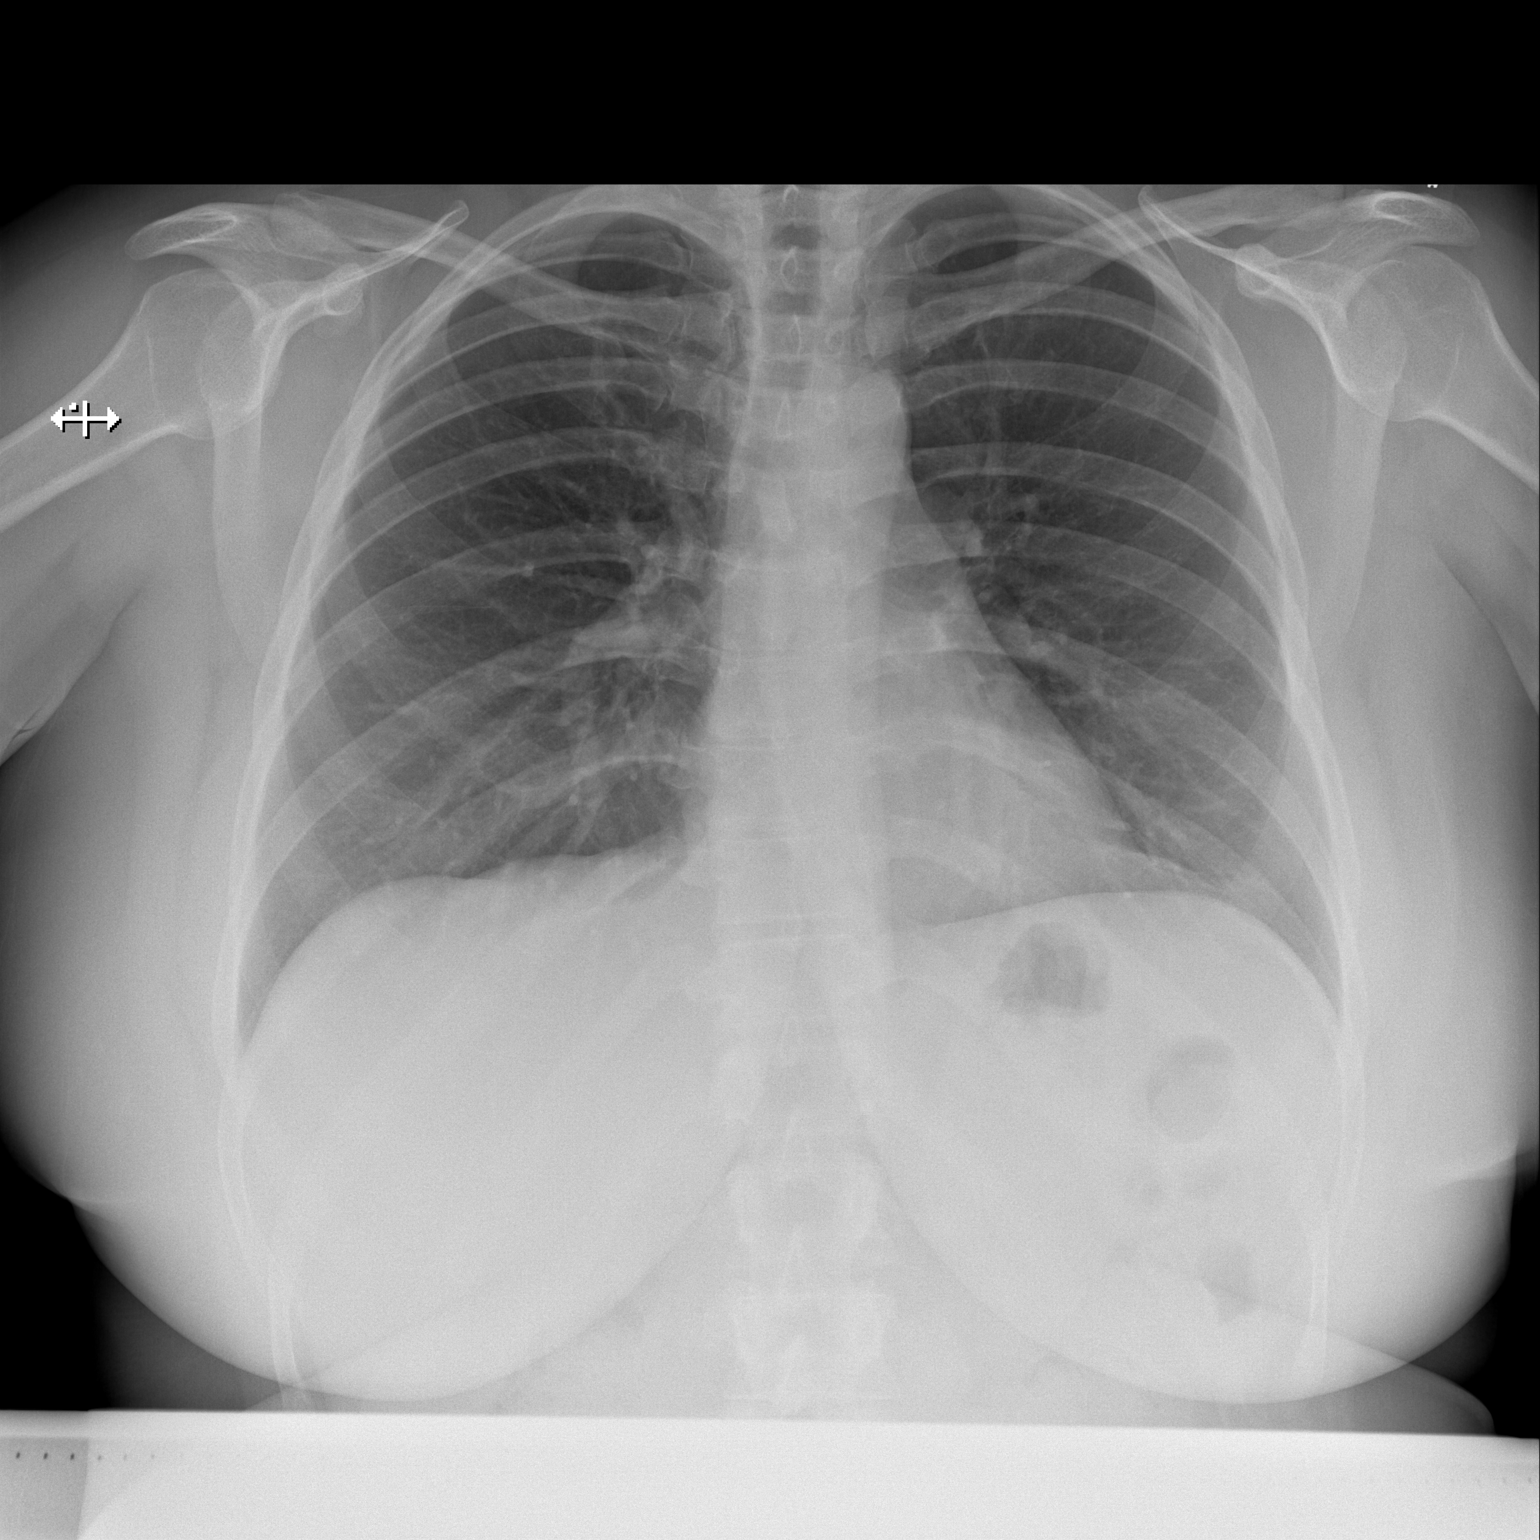

[w chest lat]
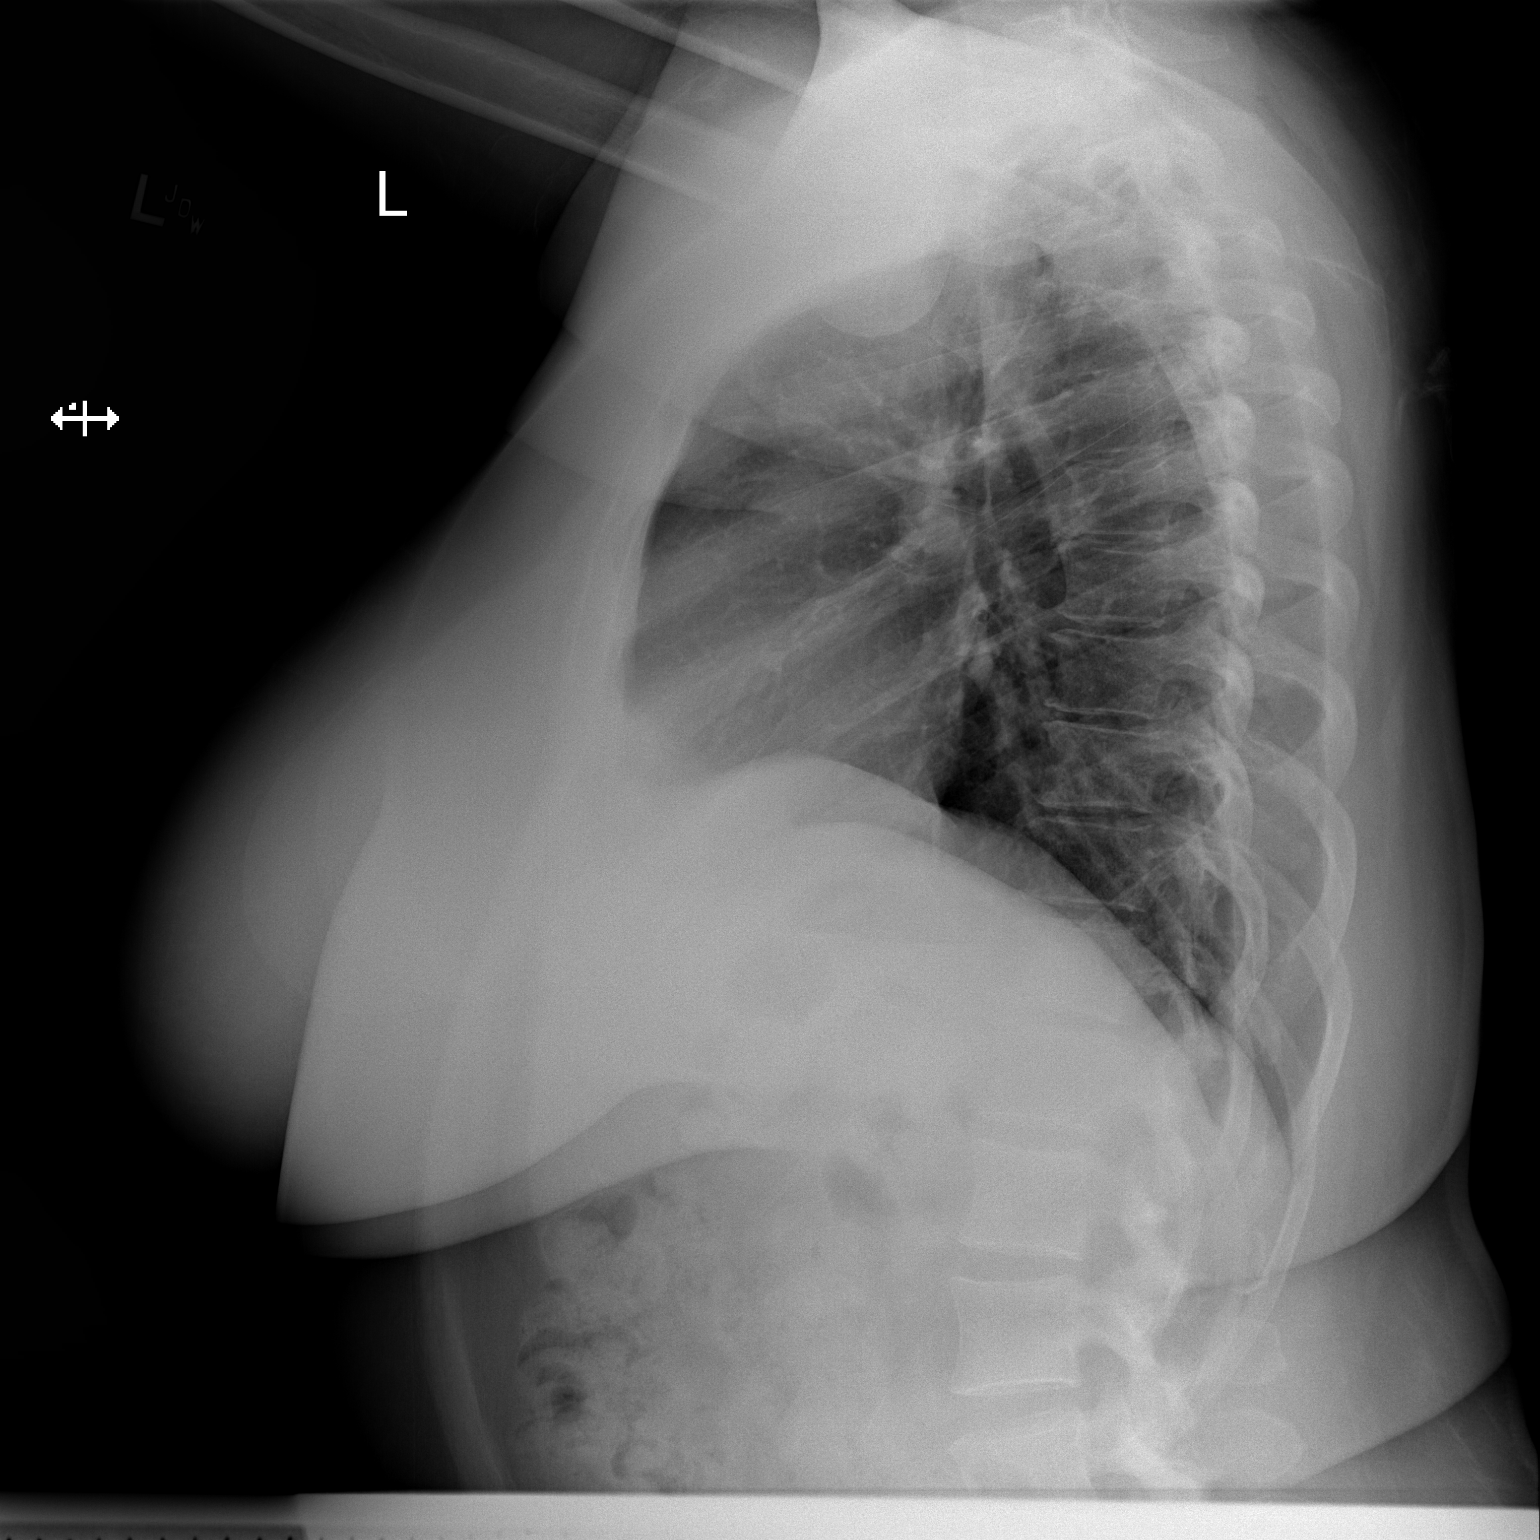

[2 of 2 positions shown; findings below may reference images not displayed]

FINDINGS: The heart size and mediastinal contours are within normal limits.
Both lungs are clear. The visualized skeletal structures are
unremarkable.
IMPRESSION: No active cardiopulmonary disease.
